# Patient Record
Sex: Female | Born: 2000 | Hispanic: Yes | Marital: Single | Smoking: Never smoker
Health system: Southern US, Community
[De-identification: ages and names within clinical notes are randomized; demographics above are authoritative.]

## PROBLEM LIST (undated history)

## (undated) DIAGNOSIS — D649 Anemia, unspecified: Secondary | ICD-10-CM

---
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
## 2022-04-06 DIAGNOSIS — J020 Streptococcal pharyngitis: Secondary | ICD-10-CM | POA: Diagnosis not present

## 2022-04-08 DIAGNOSIS — Z1152 Encounter for screening for COVID-19: Secondary | ICD-10-CM | POA: Diagnosis not present

## 2022-04-08 DIAGNOSIS — R051 Acute cough: Secondary | ICD-10-CM | POA: Diagnosis not present

## 2022-04-08 DIAGNOSIS — J029 Acute pharyngitis, unspecified: Secondary | ICD-10-CM | POA: Diagnosis not present

## 2022-04-08 DIAGNOSIS — R0602 Shortness of breath: Secondary | ICD-10-CM | POA: Diagnosis not present

## 2022-04-08 DIAGNOSIS — R0600 Dyspnea, unspecified: Secondary | ICD-10-CM | POA: Diagnosis not present

## 2022-04-08 DIAGNOSIS — R059 Cough, unspecified: Secondary | ICD-10-CM | POA: Diagnosis not present

## 2023-09-22 ENCOUNTER — Telehealth: Payer: Self-pay | Admitting: *Deleted

## 2023-09-22 DIAGNOSIS — R0600 Dyspnea, unspecified: Secondary | ICD-10-CM

## 2023-09-22 NOTE — Progress Notes (Unsigned)
 Complex Care Management Note Care Guide Note  09/22/2023 Name: Teresa Miles MRN: [id number] DOB: 11-07-2000   Complex Care Management Outreach Attempts: An unsuccessful telephone outreach was attempted today to offer the patient information about available complex care management services.  Follow Up Plan:  Additional outreach attempts will be made to offer the patient complex care management information and services.   Encounter Outcome:  No Answer  Sig Harlene Satterfield  [facility]  [facility], [facility] Guide  Direct Dial: [phone]  Fax [phone]\

## 2023-09-23 NOTE — Progress Notes (Unsigned)
 Complex Care Management Note Care Guide Note  09/23/2023 Name: Alisandra Son MRN: [id number] DOB: May 11, 2000   Complex Care Management Outreach Attempts: A second unsuccessful outreach was attempted today to offer the patient with information about available complex care management services.  Follow Up Plan:  Additional outreach attempts will be made to offer the patient complex care management information and services.   Encounter Outcome:  No Answer  Harlene Satterfield  [facility]  [facility], [facility] Guide  Direct Dial: [phone]  Fax [phone]

## 2023-09-24 NOTE — Progress Notes (Signed)
 Complex Care Management Note Care Guide Note  09/24/2023 Name: Teresa Miles MRN: [id number] DOB: 2000/08/04   Complex Care Management Outreach Attempts: A third unsuccessful outreach was attempted today to offer the patient with information about available complex care management services.  Follow Up Plan:  No further outreach attempts will be made at this time. We have been unable to contact the patient to offer or enroll patient in complex care management services.  Encounter Outcome:  No Answer  Harlene Satterfield  [facility]  [facility], [facility] Guide  Direct Dial: [phone]  Fax [phone]
